# Patient Record
Sex: Female | Born: 2000 | Race: Black or African American | Hispanic: No | Marital: Single | Smoking: Never smoker
Health system: Southern US, Community
[De-identification: ages and names within clinical notes are randomized; demographics above are authoritative.]

## PROBLEM LIST (undated history)

## (undated) DIAGNOSIS — J45909 Unspecified asthma, uncomplicated: Secondary | ICD-10-CM

## (undated) HISTORY — DX: Unspecified asthma, uncomplicated: J45.909

---
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
## 2022-01-26 DIAGNOSIS — H5213 Myopia, bilateral: Secondary | ICD-10-CM | POA: Diagnosis not present

## 2022-06-08 ENCOUNTER — Other Ambulatory Visit: Payer: Self-pay

## 2022-06-08 ENCOUNTER — Emergency Department: Payer: Medicaid Other

## 2022-06-08 ENCOUNTER — Emergency Department
Admission: EM | Admit: 2022-06-08 | Discharge: 2022-06-08 | Disposition: A | Discharge status: Home / Self Care | Payer: Medicaid Other | Attending: Emergency Medicine | Admitting: Emergency Medicine

## 2022-06-08 DIAGNOSIS — Y92410 Unspecified street and highway as the place of occurrence of the external cause: Secondary | ICD-10-CM | POA: Insufficient documentation

## 2022-06-08 DIAGNOSIS — J45909 Unspecified asthma, uncomplicated: Secondary | ICD-10-CM | POA: Insufficient documentation

## 2022-06-08 DIAGNOSIS — M79605 Pain in left leg: Secondary | ICD-10-CM | POA: Insufficient documentation

## 2022-06-08 MED ORDER — MELOXICAM 15 MG PO TABS: 15.0000 mg | ORAL_TABLET | Freq: Every day | ORAL | 0 refills | Status: AC
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
## 2022-06-08 NOTE — Discharge Instructions (Addendum)
-  Take meloxicam and acetaminophen as needed for pain  -Follow up with the orthopedist listed in these instructions if your pain does not improve after 1 week  -Return to the emergency department at any time if you begin to experience any new or worsening symptoms.

## 2022-06-08 NOTE — ED Notes (Signed)
Pt Dc to home.DC instructions reviewed with all questions answered. Pt verbalizes understanding. Pt ambulatory out of dept with steady gait.  
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
## 2023-02-05 DIAGNOSIS — H5213 Myopia, bilateral: Secondary | ICD-10-CM | POA: Diagnosis not present

## 2023-09-13 ENCOUNTER — Encounter: Payer: Self-pay | Admitting: Emergency Medicine

## 2023-09-13 ENCOUNTER — Ambulatory Visit
Admission: EM | Admit: 2023-09-13 | Discharge: 2023-09-13 | Disposition: A | Discharge status: Home / Self Care | Payer: Medicaid Other | Attending: Emergency Medicine | Admitting: Emergency Medicine

## 2023-09-13 DIAGNOSIS — T7840XA Allergy, unspecified, initial encounter: Secondary | ICD-10-CM | POA: Diagnosis not present

## 2023-09-13 DIAGNOSIS — R21 Rash and other nonspecific skin eruption: Secondary | ICD-10-CM | POA: Diagnosis not present

## 2023-09-13 DIAGNOSIS — R220 Localized swelling, mass and lump, head: Secondary | ICD-10-CM | POA: Diagnosis not present

## 2023-09-13 MED ORDER — PREDNISONE 20 MG PO TABS: 40.0000 mg | ORAL_TABLET | Freq: Every day | ORAL | 0 refills | Status: DC

## 2023-09-13 MED ORDER — METHYLPREDNISOLONE ACETATE 80 MG/ML IJ SUSP
60.0000 mg | Freq: Once | INTRAMUSCULAR | Status: AC
  Administered 2023-09-13: 60 mg via INTRAMUSCULAR

## 2023-09-13 NOTE — Discharge Instructions (Addendum)
Today you are being treated for the rash of a un known cause  You have been given an injection of steroids today in the office today to help reduce the inflammatory process that occurs with this rash which will help minimize your itching as well as begin to clear  Starting tomorrow take prednisone every morning with food as directed, to continue the above process  You may continue use of topical calamine or Benadryl cream to help manage itching, you may also continue oral Benadryl  You may follow-up with his urgent care as needed if symptoms persist or worsen

## 2023-09-13 NOTE — ED Provider Notes (Signed)
[facility]    CSN: [id number] Arrival date & time: 09/13/23  1004      History   Chief Complaint Chief Complaint  Patient presents with   Rash   Oral Swelling    HPI Margaret Wilcox is a 22 y.o. female.   Patient presents for evaluation of bilateral lip swelling and a rash present to the lips and chin beginning this morning upon awakening.  Has taken 1 dose of Benadryl which helped to reduce swelling.  Denies changes in diet, toiletries, medications.  Denies respiratory symptoms.  Has not occurred prior.  Denies drainage or fever.   Past Medical History:  Diagnosis Date   Asthma     Patient Active Problem List   Diagnosis Date Noted   Allergic rhinitis 02/21/2017   Myopia of both eyes 09/06/2015   Migraine without aura 08/09/2015   Asthma 08/09/2015    History reviewed. No pertinent surgical history.  OB History   No obstetric history on file.      Home Medications    Prior to Admission medications   Medication Sig Start Date End Date Taking? Authorizing Provider  diphenhydrAMINE (BENADRYL) 25 MG tablet Take 1 tablet (25 mg total) by mouth at bedtime as needed. 02/21/17  Yes Particia Nearing, PA-C  albuterol (PROVENTIL HFA;VENTOLIN HFA) 108 (90 Base) MCG/ACT inhaler Inhale 1-2 puffs into the lungs every 6 (six) hours as needed for wheezing or shortness of breath. 08/08/18   Khatri, Hina, PA-C  fluticasone (FLONASE) 50 MCG/ACT nasal spray Place 1 spray into both nostrils daily. 08/08/18   Dietrich Pates, PA-C    Family History Family History  Problem Relation Age of Onset   Allergies Sister     Social History Social History   Tobacco Use   Smoking status: Never   Smokeless tobacco: Never  Vaping Use   Vaping status: Never Used  Substance Use Topics   Alcohol use: No   Drug use: No     Allergies   Patient has no known allergies.   Review of Systems Review of Systems  Skin:  Positive for rash.     Physical Exam Triage  Vital Signs ED Triage Vitals  Encounter Vitals Group     BP 09/13/23 1017 117/78     Systolic BP Percentile --      Diastolic BP Percentile --      Pulse Rate 09/13/23 1017 85     Resp 09/13/23 1017 14     Temp 09/13/23 1017 98.1 F (36.7 C)     Temp Source 09/13/23 1017 Oral     SpO2 09/13/23 1017 98 %     Weight 09/13/23 1016 110 lb (49.9 kg)     Height 09/13/23 1016 5\' 3"  (1.6 m)     Head Circumference --      Peak Flow --      Pain Score 09/13/23 1016 0     Pain Loc --      Pain Education --      Exclude from Growth Chart --    No data found.  Updated Vital Signs BP 117/78 (BP Location: Right Arm)   Pulse 85   Temp 98.1 F (36.7 C) (Oral)   Resp 14   Ht 5\' 3"  (1.6 m)   Wt 110 lb (49.9 kg)   LMP 08/17/2023 (Approximate)   SpO2 98%   BMI 19.49 kg/m   Visual Acuity Right Eye Distance:   Left Eye Distance:   Bilateral  Distance:    Right Eye Near:   Left Eye Near:    Bilateral Near:     Physical Exam Constitutional:      Appearance: Normal appearance.  HENT:     Mouth/Throat:     Mouth: Mucous membranes are moist.     Pharynx: Oropharynx is clear.     Comments: Mild bilateral lip swelling with fine flesh tone papular rash to the right commissure to the chin Eyes:     Extraocular Movements: Extraocular movements intact.  Pulmonary:     Effort: Pulmonary effort is normal.  Neurological:     Mental Status: She is alert and oriented to person, place, and time. Mental status is at baseline.      UC Treatments / Results  Labs (all labs ordered are listed, but only abnormal results are displayed) Labs Reviewed - No data to display  EKG   Radiology No results found.  Procedures Procedures (including critical care time)  Medications Ordered in UC Medications - No data to display  Initial Impression / Assessment and Plan / UC Course  I have reviewed the triage vital signs and the nursing notes.  Pertinent labs & imaging results that were  available during my care of the patient were reviewed by me and considered in my medical decision making (see chart for details).  Lip swelling, rash, allergic reaction, initial encounter  Vitals are stable, patient in no sign distress nontoxic-appearing, no respiratory involvement and pharynx is clear without obstruction, unknown etiology of symptoms, advised to monitor, methylprednisolone injection given and prescribed prednisone for outpatient use, patient to return if symptoms worsen Final Clinical Impressions(s) / UC Diagnoses   Final diagnoses:  None   Discharge Instructions   None    ED Prescriptions   None    PDMP not reviewed this encounter.   Valinda Hoar, NP 09/13/23 1051

## 2023-09-13 NOTE — ED Triage Notes (Signed)
Patient states that she ate a steak and cheese stromoli last night.  Patient states that she woke up this morning and after she washed her face noticed rash on her bottom face and her upper lip was swollen.  Patient took 1 tablet of Benadryl this morning.

## 2024-03-22 ENCOUNTER — Emergency Department

## 2024-03-22 ENCOUNTER — Emergency Department
Admission: EM | Admit: 2024-03-22 | Discharge: 2024-03-22 | Disposition: A | Discharge status: Home / Self Care | Attending: Emergency Medicine | Admitting: Emergency Medicine

## 2024-03-22 ENCOUNTER — Other Ambulatory Visit: Payer: Self-pay

## 2024-03-22 DIAGNOSIS — S199XXA Unspecified injury of neck, initial encounter: Secondary | ICD-10-CM | POA: Diagnosis not present

## 2024-03-22 DIAGNOSIS — G44319 Acute post-traumatic headache, not intractable: Secondary | ICD-10-CM

## 2024-03-22 DIAGNOSIS — R0789 Other chest pain: Secondary | ICD-10-CM | POA: Diagnosis not present

## 2024-03-22 DIAGNOSIS — R519 Headache, unspecified: Secondary | ICD-10-CM | POA: Diagnosis not present

## 2024-03-22 DIAGNOSIS — R42 Dizziness and giddiness: Secondary | ICD-10-CM | POA: Diagnosis not present

## 2024-03-22 DIAGNOSIS — R55 Syncope and collapse: Secondary | ICD-10-CM | POA: Insufficient documentation

## 2024-03-22 DIAGNOSIS — S0990XA Unspecified injury of head, initial encounter: Secondary | ICD-10-CM | POA: Diagnosis not present

## 2024-03-22 DIAGNOSIS — R079 Chest pain, unspecified: Secondary | ICD-10-CM | POA: Diagnosis not present

## 2024-03-22 LAB — URINALYSIS, ROUTINE W REFLEX MICROSCOPIC
Bilirubin Urine: NEGATIVE
Glucose, UA: NEGATIVE mg/dL
Hgb urine dipstick: NEGATIVE
Ketones, ur: NEGATIVE mg/dL
Leukocytes,Ua: NEGATIVE
Nitrite: NEGATIVE
Protein, ur: NEGATIVE mg/dL
Specific Gravity, Urine: 1.017 (ref 1.005–1.030)
pH: 6 (ref 5.0–8.0)

## 2024-03-22 LAB — COMPREHENSIVE METABOLIC PANEL WITH GFR
ALT: 14 U/L (ref 0–44)
AST: 21 U/L (ref 15–41)
Albumin: 4.2 g/dL (ref 3.5–5.0)
Alkaline Phosphatase: 42 U/L (ref 38–126)
Anion gap: 9 (ref 5–15)
BUN: 14 mg/dL (ref 6–20)
CO2: 18 mmol/L — ABNORMAL LOW (ref 22–32)
Calcium: 9.1 mg/dL (ref 8.9–10.3)
Chloride: 111 mmol/L (ref 98–111)
Creatinine, Ser: 0.66 mg/dL (ref 0.44–1.00)
GFR, Estimated: >60 mL/min (ref >60)
Glucose, Bld: 82 mg/dL (ref 70–99)
Potassium: 4.3 mmol/L (ref 3.5–5.1)
Sodium: 138 mmol/L (ref 135–145)
Total Bilirubin: 1.4 mg/dL — ABNORMAL HIGH (ref 0.0–1.2)
Total Protein: 7.4 g/dL (ref 6.5–8.1)

## 2024-03-22 LAB — TROPONIN I (HIGH SENSITIVITY): Troponin I (High Sensitivity): <2 ng/L (ref <18)

## 2024-03-22 LAB — POC URINE PREG, ED: Preg Test, Ur: NEGATIVE
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
## 2024-03-22 NOTE — Discharge Instructions (Signed)
 Follow-up with the doctor listed on your discharge papers or one of the doctors that are listed below.  Call their clinics on Monday to make an appointment for follow-up of this ED visit.  Make sure that you are drinking fluids to stay hydrated especially in the hot weather and drinking electrolytes as you have been doing such as Gatorade.  Tylenol  as needed for headache.  Return to the emergency department if any severe worsening of your symptoms or urgent concerns.  Please go to the following website to schedule new (and existing) patient appointments:   [email or web address]   The following is a list of primary care offices in the area who are accepting new patients at this time.  Please reach out to one of them directly and let them know you would like to schedule an appointment to follow up on an Emergency Department visit, and/or to establish a new primary care provider (PCP).  There are likely other primary care clinics in the are who are accepting new patients, but this is an excellent place to start:  [facility] Lead physician: Dr Aden Agreste [address] [place], [place] [address] [phone]  [facility] Lead Physician: Dr Arleen Lacer [address], [place], [place] [address] [phone]  [facility]  Lead Physician: Dr Terre Ferri [address] [place], [place] [address] [phone]  [facility] Lead Physician: Dr Audrie Blind [address] [place], [place] [address] [phone]  [facility] at [facility] Lead Physician: Dr Janna Melter [address] [place], [place], [place] [address] [phone]
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
## 2024-03-23 ENCOUNTER — Ambulatory Visit
Admission: EM | Admit: 2024-03-23 | Discharge: 2024-03-23 | Disposition: A | Discharge status: Home / Self Care | Attending: Emergency Medicine | Admitting: Emergency Medicine

## 2024-03-23 DIAGNOSIS — G43809 Other migraine, not intractable, without status migrainosus: Secondary | ICD-10-CM | POA: Diagnosis not present

## 2024-03-23 DIAGNOSIS — S060X0A Concussion without loss of consciousness, initial encounter: Secondary | ICD-10-CM

## 2024-03-23 DIAGNOSIS — H6123 Impacted cerumen, bilateral: Secondary | ICD-10-CM | POA: Diagnosis not present

## 2024-03-23 MED ORDER — IBUPROFEN 600 MG PO TABS: 600.0000 mg | ORAL_TABLET | Freq: Four times a day (QID) | ORAL | 0 refills | Status: AC | PRN

## 2024-03-23 MED ORDER — DEXAMETHASONE SODIUM PHOSPHATE 10 MG/ML IJ SOLN
10.0000 mg | Freq: Once | INTRAMUSCULAR | Status: AC
  Administered 2024-03-23: 10 mg via INTRAMUSCULAR

## 2024-03-23 MED ORDER — ONDANSETRON 8 MG PO TBDP
8.0000 mg | ORAL_TABLET | Freq: Once | ORAL | Status: AC
  Administered 2024-03-23: 8 mg via ORAL

## 2024-03-23 MED ORDER — ACETAMINOPHEN 325 MG PO TABS
975.0000 mg | ORAL_TABLET | Freq: Once | ORAL | Status: AC
  Administered 2024-03-23: 975 mg via ORAL

## 2024-03-23 MED ORDER — KETOROLAC TROMETHAMINE 30 MG/ML IJ SOLN
30.0000 mg | Freq: Once | INTRAMUSCULAR | Status: AC
  Administered 2024-03-23: 30 mg via INTRAMUSCULAR

## 2024-03-23 MED ORDER — PROCHLORPERAZINE MALEATE 10 MG PO TABS: 10.0000 mg | ORAL_TABLET | Freq: Three times a day (TID) | ORAL | 0 refills | Status: AC | PRN
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
## 2024-03-23 NOTE — Discharge Instructions (Addendum)
 I have given you dexamethasone, Toradol, Tylenol  and Zofran.  You should start feeling better soon.  Take 600 mg of ibuprofen , 1000 mg of Tylenol  3-4 times a day as needed for headache.  Compazine will help with nausea and will also help you sleep.  Discontinue this if you start to feel restless.   Limit screen time.  Cognitive rest.  Tylenol /ibuprofen .  Push plenty of extra fluids.  Omega-3 supplements may also help. I have also put in referral to Dr. Augustus Ledger for evaluation of your likely concussion.   If you have not heard them in a day or 2, call them.  They can also connect you with a primary care provider.  Go to the ED if you get worse.  You can try Debrox to help with the earwax buildup.  If this does not get any better, then follow-up with [facility].

## 2024-03-23 NOTE — ED Triage Notes (Signed)
 Pt c/o head injury since 6/5. States she tripped over a suitcase at home & struck the back of her head against the door. Was seen yesterday at [facility] ED & had CT head/spine & CXR all WNL. Having worsening HA's,dizziness, L eye pressure & nausea since yesterday. No OTC meds.

## 2024-07-05 DIAGNOSIS — M7541 Impingement syndrome of right shoulder: Secondary | ICD-10-CM | POA: Diagnosis not present

## 2024-07-21 DIAGNOSIS — M7541 Impingement syndrome of right shoulder: Secondary | ICD-10-CM | POA: Diagnosis not present

## 2024-08-04 DIAGNOSIS — M7521 Bicipital tendinitis, right shoulder: Secondary | ICD-10-CM | POA: Diagnosis not present

## 2024-08-04 DIAGNOSIS — M7551 Bursitis of right shoulder: Secondary | ICD-10-CM | POA: Diagnosis not present
